# Patient Record
Sex: Female | Born: 1950 | Race: White | Hispanic: No | Marital: Married | Smoking: Current every day smoker
Health system: Southern US, Community
[De-identification: ages and names within clinical notes are randomized; demographics above are authoritative.]

## PROBLEM LIST (undated history)

## (undated) DIAGNOSIS — T7840XA Allergy, unspecified, initial encounter: Secondary | ICD-10-CM

## (undated) HISTORY — DX: Allergy, unspecified, initial encounter: T78.40XA

---

## 2000-09-23 ENCOUNTER — Other Ambulatory Visit: Admission: RE | Admit: 2000-09-23 | Discharge: 2000-09-23 | Payer: Self-pay | Admitting: Obstetrics and Gynecology

## 2001-11-01 ENCOUNTER — Other Ambulatory Visit: Admission: RE | Admit: 2001-11-01 | Discharge: 2001-11-01 | Payer: Self-pay | Admitting: Obstetrics and Gynecology

## 2002-12-29 ENCOUNTER — Other Ambulatory Visit: Admission: RE | Admit: 2002-12-29 | Discharge: 2002-12-29 | Payer: Self-pay | Admitting: Obstetrics and Gynecology

## 2004-01-17 ENCOUNTER — Other Ambulatory Visit: Admission: RE | Admit: 2004-01-17 | Discharge: 2004-01-17 | Payer: Self-pay | Admitting: Obstetrics and Gynecology

## 2005-02-11 ENCOUNTER — Other Ambulatory Visit: Admission: RE | Admit: 2005-02-11 | Discharge: 2005-02-11 | Payer: Self-pay | Admitting: Obstetrics and Gynecology

## 2016-10-07 DIAGNOSIS — Z1231 Encounter for screening mammogram for malignant neoplasm of breast: Secondary | ICD-10-CM | POA: Diagnosis not present

## 2016-10-15 DIAGNOSIS — Z01419 Encounter for gynecological examination (general) (routine) without abnormal findings: Secondary | ICD-10-CM | POA: Diagnosis not present

## 2016-10-15 DIAGNOSIS — N951 Menopausal and female climacteric states: Secondary | ICD-10-CM | POA: Diagnosis not present

## 2016-10-15 DIAGNOSIS — Z6821 Body mass index (BMI) 21.0-21.9, adult: Secondary | ICD-10-CM | POA: Diagnosis not present

## 2016-10-26 DIAGNOSIS — Z23 Encounter for immunization: Secondary | ICD-10-CM | POA: Diagnosis not present

## 2016-11-09 DIAGNOSIS — F17210 Nicotine dependence, cigarettes, uncomplicated: Secondary | ICD-10-CM | POA: Diagnosis not present

## 2016-11-09 DIAGNOSIS — J45909 Unspecified asthma, uncomplicated: Secondary | ICD-10-CM | POA: Diagnosis not present

## 2016-11-09 DIAGNOSIS — J309 Allergic rhinitis, unspecified: Secondary | ICD-10-CM | POA: Diagnosis not present

## 2016-11-09 DIAGNOSIS — E7800 Pure hypercholesterolemia, unspecified: Secondary | ICD-10-CM | POA: Diagnosis not present

## 2016-11-09 DIAGNOSIS — Z0000 Encounter for general adult medical examination without abnormal findings: Secondary | ICD-10-CM | POA: Diagnosis not present

## 2016-12-22 DIAGNOSIS — Z780 Asymptomatic menopausal state: Secondary | ICD-10-CM | POA: Diagnosis not present

## 2017-01-05 DIAGNOSIS — Z23 Encounter for immunization: Secondary | ICD-10-CM | POA: Diagnosis not present

## 2017-02-12 DIAGNOSIS — J069 Acute upper respiratory infection, unspecified: Secondary | ICD-10-CM | POA: Diagnosis not present

## 2017-10-01 DIAGNOSIS — Z23 Encounter for immunization: Secondary | ICD-10-CM | POA: Diagnosis not present

## 2017-10-08 DIAGNOSIS — Z803 Family history of malignant neoplasm of breast: Secondary | ICD-10-CM | POA: Diagnosis not present

## 2017-10-08 DIAGNOSIS — Z1231 Encounter for screening mammogram for malignant neoplasm of breast: Secondary | ICD-10-CM | POA: Diagnosis not present

## 2017-11-15 DIAGNOSIS — J45909 Unspecified asthma, uncomplicated: Secondary | ICD-10-CM | POA: Diagnosis not present

## 2017-11-15 DIAGNOSIS — Z0000 Encounter for general adult medical examination without abnormal findings: Secondary | ICD-10-CM | POA: Diagnosis not present

## 2017-11-15 DIAGNOSIS — F17210 Nicotine dependence, cigarettes, uncomplicated: Secondary | ICD-10-CM | POA: Diagnosis not present

## 2017-11-15 DIAGNOSIS — Z1389 Encounter for screening for other disorder: Secondary | ICD-10-CM | POA: Diagnosis not present

## 2017-11-15 DIAGNOSIS — Z1211 Encounter for screening for malignant neoplasm of colon: Secondary | ICD-10-CM | POA: Diagnosis not present

## 2017-11-15 DIAGNOSIS — J309 Allergic rhinitis, unspecified: Secondary | ICD-10-CM | POA: Diagnosis not present

## 2017-11-15 DIAGNOSIS — E7800 Pure hypercholesterolemia, unspecified: Secondary | ICD-10-CM | POA: Diagnosis not present

## 2019-12-18 ENCOUNTER — Telehealth (HOSPITAL_COMMUNITY): Payer: Self-pay | Admitting: *Deleted

## 2019-12-18 NOTE — Telephone Encounter (Signed)
Unable to reach pt to schedule appt, left VM for pt to call

## 2020-01-02 ENCOUNTER — Other Ambulatory Visit: Payer: Self-pay

## 2020-01-02 ENCOUNTER — Other Ambulatory Visit (HOSPITAL_COMMUNITY): Payer: Self-pay | Admitting: Family Medicine

## 2020-01-02 ENCOUNTER — Ambulatory Visit (HOSPITAL_COMMUNITY)
Admission: RE | Admit: 2020-01-02 | Discharge: 2020-01-02 | Disposition: A | Discharge status: Home / Self Care | Payer: Medicare Other | Source: Ambulatory Visit | Attending: Vascular Surgery | Admitting: Vascular Surgery

## 2020-01-02 DIAGNOSIS — I739 Peripheral vascular disease, unspecified: Secondary | ICD-10-CM

## 2020-04-20 HISTORY — PX: VENOUS ABLATION: SHX2656

## 2020-11-08 ENCOUNTER — Other Ambulatory Visit: Payer: Self-pay | Admitting: Family Medicine

## 2020-11-08 DIAGNOSIS — R0989 Other specified symptoms and signs involving the circulatory and respiratory systems: Secondary | ICD-10-CM

## 2020-11-26 ENCOUNTER — Ambulatory Visit
Admission: RE | Admit: 2020-11-26 | Discharge: 2020-11-26 | Disposition: A | Discharge status: Home / Self Care | Payer: Medicare Other | Source: Ambulatory Visit | Attending: Family Medicine | Admitting: Family Medicine

## 2020-11-26 DIAGNOSIS — R0989 Other specified symptoms and signs involving the circulatory and respiratory systems: Secondary | ICD-10-CM

## 2020-12-24 DIAGNOSIS — I87323 Chronic venous hypertension (idiopathic) with inflammation of bilateral lower extremity: Secondary | ICD-10-CM | POA: Diagnosis not present

## 2021-01-08 ENCOUNTER — Other Ambulatory Visit: Payer: Self-pay

## 2021-01-08 DIAGNOSIS — I739 Peripheral vascular disease, unspecified: Secondary | ICD-10-CM

## 2021-01-28 ENCOUNTER — Ambulatory Visit: Payer: Medicare Other | Admitting: Vascular Surgery

## 2021-01-28 ENCOUNTER — Encounter: Payer: Self-pay | Admitting: Vascular Surgery

## 2021-01-28 ENCOUNTER — Ambulatory Visit (HOSPITAL_COMMUNITY)
Admission: RE | Admit: 2021-01-28 | Discharge: 2021-01-28 | Disposition: A | Discharge status: Home / Self Care | Payer: Medicare Other | Source: Ambulatory Visit | Attending: Vascular Surgery | Admitting: Vascular Surgery

## 2021-01-28 ENCOUNTER — Other Ambulatory Visit: Payer: Self-pay

## 2021-01-28 VITALS — BP 149/69 | HR 65 | Temp 97.7°F | Resp 20 | Ht 65.0 in | Wt 139.0 lb

## 2021-01-28 DIAGNOSIS — I739 Peripheral vascular disease, unspecified: Secondary | ICD-10-CM

## 2021-01-28 DIAGNOSIS — I8390 Asymptomatic varicose veins of unspecified lower extremity: Secondary | ICD-10-CM | POA: Diagnosis not present

## 2021-01-28 NOTE — Progress Notes (Signed)
ASSESSMENT & PLAN:  70 y.o. female without evidence of hemodynamically significant peripheral artery disease. 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 Can follow up with [facility] or her established vein specialist for these. Otherwise follow up with me PRN.  CHIEF COMPLAINT:   Concern for peripheral arterial disease  HISTORY:  HISTORY OF PRESENT ILLNESS: Monica Suarez is a 70 y.o. female referred to clinic for evaluation of possible peripheral arterial disease.  She reported cramping in the legs to her primary care physician which is worsened over the past 4 months.  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  She can walk as far she wants.  Has no symptoms of claudication.  She has no symptoms typical of ischemic rest pain.  She has no history of ischemic ulceration  Past Medical History:  Diagnosis Date  . Allergy     Past Surgical History:  Procedure Laterality Date  . VENOUS ABLATION Left 04/2020    History reviewed. No pertinent family history.  Social History   Socioeconomic History  . Marital status: Married    Spouse name: Not on file  . Number of children: Not on file  . Years of education: Not on file  . Highest education level: Not on file  Occupational History  . Not on file  Tobacco Use  . Smoking status: Current Every Day Smoker    Packs/day: 1.00  . Smokeless tobacco: Never Used  Vaping Use  . Vaping Use: Never used  Substance and Sexual Activity  . Alcohol use: Not Currently  . Drug use: Never  . Sexual activity: Not on file  Other Topics Concern  . Not on file  Social History Narrative  . Not on file   Social Determinants of Health   Financial Resource Strain: Not on file  Food Insecurity: Not on file  Transportation Needs: Not on file  Physical Activity:  Not on file  Stress: Not on file  Social Connections: Not on file  Intimate Partner Violence: Not on file    Allergies  Allergen Reactions  . Clindamycin Hcl     Other reaction(s): severe diarrhea  . Eggs Or Egg-Derived Products     Other reaction(s): Unknown  . Penicillin V     Other reaction(s): Unknown  . Telithromycin     Other reaction(s): glossitis    Current Outpatient Medications  Medication Sig Dispense Refill  . albuterol (VENTOLIN HFA) 108 (90 Base) MCG/ACT inhaler 2 puffs every 6 (six) hours as needed.    Marland Kitchen aspirin 325 MG tablet 1 tablet    . aspirin-acetaminophen-caffeine (EXCEDRIN EXTRA STRENGTH) 250-250-65 MG tablet 1 tablets    . fluticasone (FLONASE) 50 MCG/ACT nasal spray 1 puff in each nostril    . Phenylephrine-DM-GG-APAP (SUDAFED PE COLD/COUGH PO)      No current facility-administered medications for this visit.    REVIEW OF SYSTEMS:  [X]  denotes positive finding, [ ]  denotes negative finding Cardiac  Comments:  Chest pain or chest pressure:    Shortness of breath upon exertion:    Short of breath when lying flat:    Irregular heart rhythm:        Vascular    Pain in calf, thigh, or hip brought on by ambulation:    Pain in feet at night that wakes you up from your sleep:  Blood clot in your veins:    Leg swelling:         Pulmonary    Oxygen at home:    Productive cough:     Wheezing:         Neurologic    Sudden weakness in arms or legs:     Sudden numbness in arms or legs:     Sudden onset of difficulty speaking or slurred speech:    Temporary loss of vision in one eye:     Problems with dizziness:         Gastrointestinal    Blood in stool:     Vomited blood:         Genitourinary    Burning when urinating:     Blood in urine:        Psychiatric    Major depression:         Hematologic    Bleeding problems:    Problems with blood clotting too easily:        Skin    Rashes or ulcers:        Constitutional    Fever or  chills:     PHYSICAL EXAM:   Vitals:   01/28/21 1257  BP: (!) 149/69  Pulse: 65  Resp: 20  Temp: 97.7 F (36.5 C)  SpO2: 99%  Weight: 139 lb (63 kg)  Height: 5\' 5"  (1.651 m)   Constitutional: well appearing in no distress. Appears well nourished.  Neurologic: CN intact. no focal findings. no sensory loss. Psychiatric: Mood and affect symmetric and appropriate. Eyes: No icterus. No conjunctival pallor. Ears, nose, throat: mucous membranes moist. Midline trachea.  Cardiac: regular rate and rhythm.  Respiratory: unlabored. Abdominal: soft, non-tender, non-distended.  Peripheral vascular:  2+ DP pulses bilaterally Extremity: No edema. No cyanosis. No pallor.  Skin: No gangrene. No ulceration.  Lymphatic: No Stemmer's sign. No palpable lymphadenopathy.   DATA REVIEW:    ABI 01/28/21   Yevonne Aline. Stanford Breed, MD [facility] Phone Number: [phone] 01/28/2021 2:18 PM

## 2021-02-18 DIAGNOSIS — R195 Other fecal abnormalities: Secondary | ICD-10-CM | POA: Diagnosis not present

## 2021-04-18 DIAGNOSIS — L570 Actinic keratosis: Secondary | ICD-10-CM | POA: Diagnosis not present

## 2021-04-18 DIAGNOSIS — D225 Melanocytic nevi of trunk: Secondary | ICD-10-CM | POA: Diagnosis not present

## 2021-04-18 DIAGNOSIS — Z85828 Personal history of other malignant neoplasm of skin: Secondary | ICD-10-CM | POA: Diagnosis not present

## 2021-04-18 DIAGNOSIS — Z08 Encounter for follow-up examination after completed treatment for malignant neoplasm: Secondary | ICD-10-CM | POA: Diagnosis not present

## 2021-04-18 DIAGNOSIS — Z1283 Encounter for screening for malignant neoplasm of skin: Secondary | ICD-10-CM | POA: Diagnosis not present

## 2021-04-18 DIAGNOSIS — X32XXXD Exposure to sunlight, subsequent encounter: Secondary | ICD-10-CM | POA: Diagnosis not present

## 2021-10-02 DIAGNOSIS — H2513 Age-related nuclear cataract, bilateral: Secondary | ICD-10-CM | POA: Diagnosis not present

## 2021-10-02 DIAGNOSIS — H43813 Vitreous degeneration, bilateral: Secondary | ICD-10-CM | POA: Diagnosis not present

## 2021-10-02 DIAGNOSIS — H02834 Dermatochalasis of left upper eyelid: Secondary | ICD-10-CM | POA: Diagnosis not present

## 2021-10-02 DIAGNOSIS — H40053 Ocular hypertension, bilateral: Secondary | ICD-10-CM | POA: Diagnosis not present

## 2021-10-02 DIAGNOSIS — H0102B Squamous blepharitis left eye, upper and lower eyelids: Secondary | ICD-10-CM | POA: Diagnosis not present

## 2021-10-02 DIAGNOSIS — H0102A Squamous blepharitis right eye, upper and lower eyelids: Secondary | ICD-10-CM | POA: Diagnosis not present

## 2021-10-02 DIAGNOSIS — H1045 Other chronic allergic conjunctivitis: Secondary | ICD-10-CM | POA: Diagnosis not present

## 2021-10-02 DIAGNOSIS — H02831 Dermatochalasis of right upper eyelid: Secondary | ICD-10-CM | POA: Diagnosis not present

## 2021-10-10 DIAGNOSIS — X32XXXD Exposure to sunlight, subsequent encounter: Secondary | ICD-10-CM | POA: Diagnosis not present

## 2021-10-10 DIAGNOSIS — L570 Actinic keratosis: Secondary | ICD-10-CM | POA: Diagnosis not present

## 2021-10-10 DIAGNOSIS — L820 Inflamed seborrheic keratosis: Secondary | ICD-10-CM | POA: Diagnosis not present

## 2021-10-17 DIAGNOSIS — Z1231 Encounter for screening mammogram for malignant neoplasm of breast: Secondary | ICD-10-CM | POA: Diagnosis not present

## 2021-12-17 DIAGNOSIS — R7303 Prediabetes: Secondary | ICD-10-CM | POA: Diagnosis not present

## 2021-12-17 DIAGNOSIS — I739 Peripheral vascular disease, unspecified: Secondary | ICD-10-CM | POA: Diagnosis not present

## 2021-12-17 DIAGNOSIS — J45909 Unspecified asthma, uncomplicated: Secondary | ICD-10-CM | POA: Diagnosis not present

## 2021-12-17 DIAGNOSIS — Z1389 Encounter for screening for other disorder: Secondary | ICD-10-CM | POA: Diagnosis not present

## 2021-12-17 DIAGNOSIS — Z23 Encounter for immunization: Secondary | ICD-10-CM | POA: Diagnosis not present

## 2021-12-17 DIAGNOSIS — D692 Other nonthrombocytopenic purpura: Secondary | ICD-10-CM | POA: Diagnosis not present

## 2021-12-17 DIAGNOSIS — F17210 Nicotine dependence, cigarettes, uncomplicated: Secondary | ICD-10-CM | POA: Diagnosis not present

## 2021-12-17 DIAGNOSIS — R195 Other fecal abnormalities: Secondary | ICD-10-CM | POA: Diagnosis not present

## 2021-12-17 DIAGNOSIS — Z0000 Encounter for general adult medical examination without abnormal findings: Secondary | ICD-10-CM | POA: Diagnosis not present

## 2021-12-17 DIAGNOSIS — E7800 Pure hypercholesterolemia, unspecified: Secondary | ICD-10-CM | POA: Diagnosis not present

## 2021-12-17 DIAGNOSIS — J309 Allergic rhinitis, unspecified: Secondary | ICD-10-CM | POA: Diagnosis not present

## 2021-12-18 DIAGNOSIS — R195 Other fecal abnormalities: Secondary | ICD-10-CM | POA: Diagnosis not present

## 2022-01-02 DIAGNOSIS — H02814 Retained foreign body in left upper eyelid: Secondary | ICD-10-CM | POA: Diagnosis not present

## 2022-01-05 DIAGNOSIS — Z780 Asymptomatic menopausal state: Secondary | ICD-10-CM | POA: Diagnosis not present

## 2022-01-08 DIAGNOSIS — R195 Other fecal abnormalities: Secondary | ICD-10-CM | POA: Diagnosis not present

## 2022-02-21 IMAGING — US US CAROTID DUPLEX BILAT
1 series · 13 of 24 positions shown · non-contrast
Comparison: None.

CLINICAL DATA: Asymptomatic left bruit.  History of tobacco abuse.

EXAM:
BILATERAL CAROTID DUPLEX ULTRASOUND
TECHNIQUE: Gray scale imaging, color Doppler and duplex ultrasound were
performed of bilateral carotid and vertebral arteries in the neck.

[Series 1: us carotid duplex bilat · 0.05mm/px · 13 of 63 slices shown]
[im 1/63]
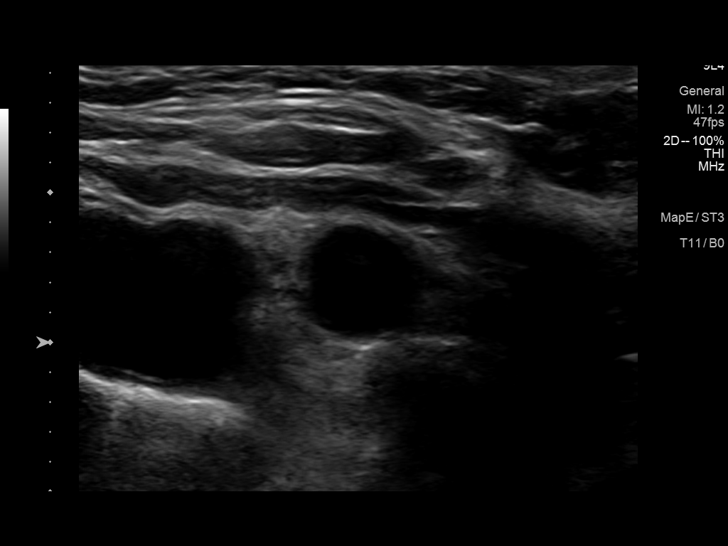
[im 6/63]
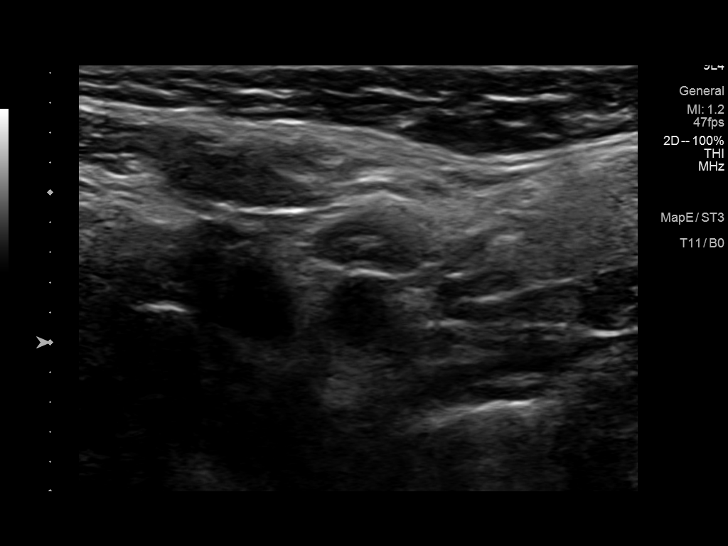
[im 11/63]
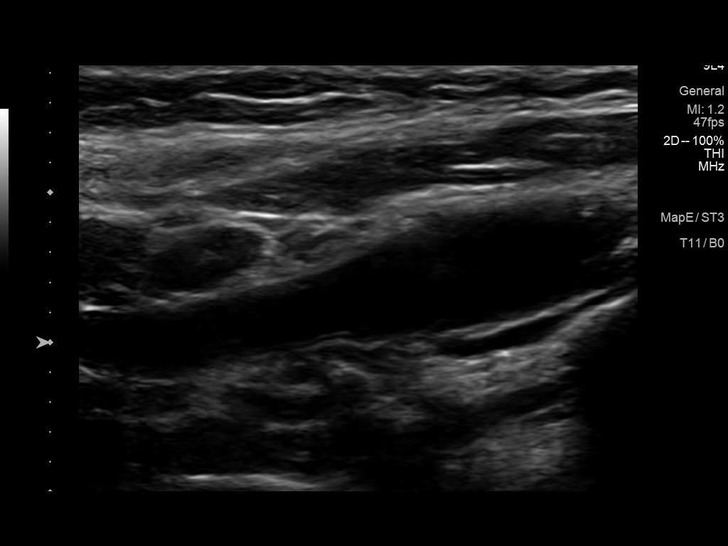
[im 17/63]
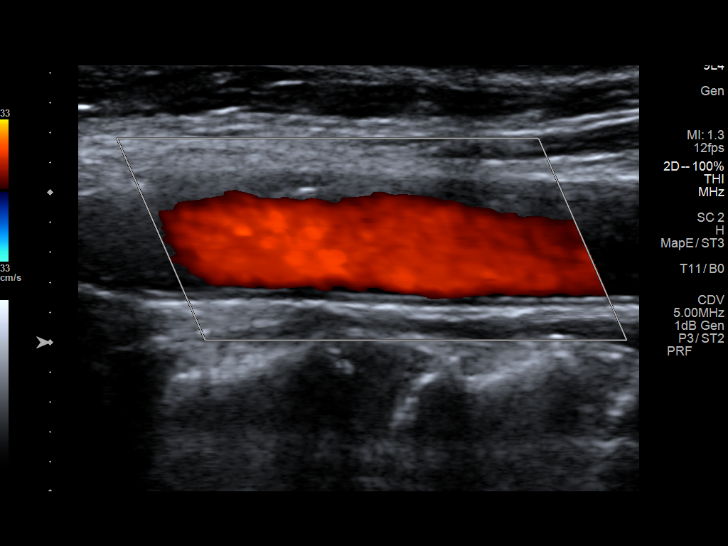
[im 22/63]
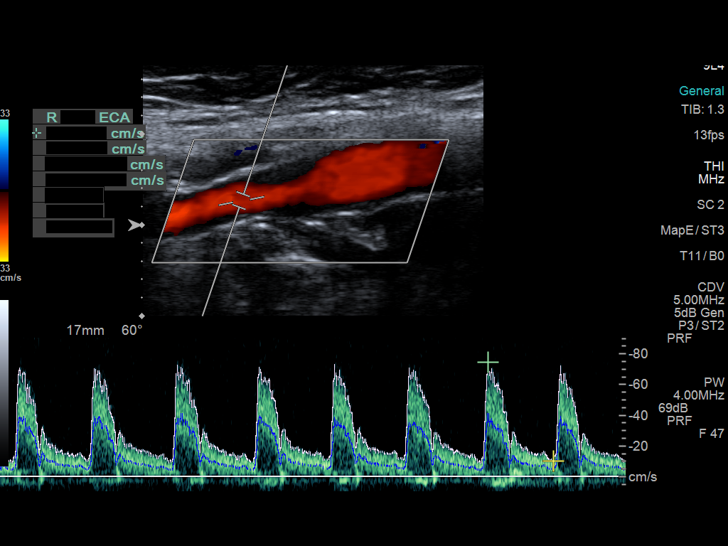
[im 27/63]
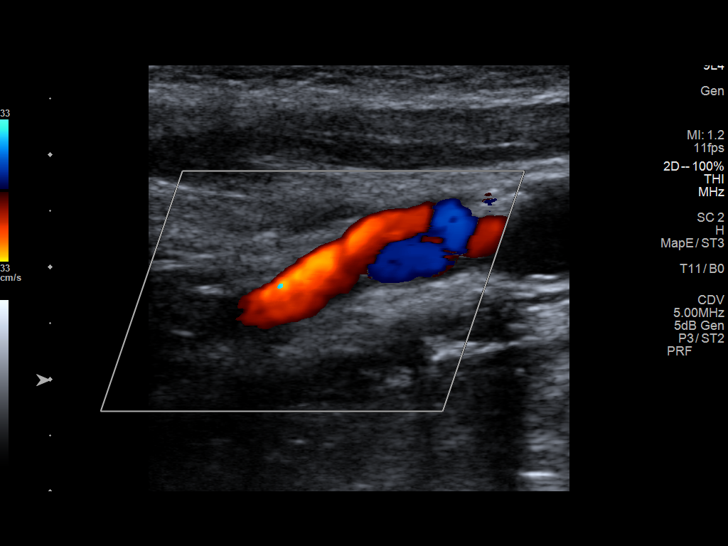
[im 33/63]
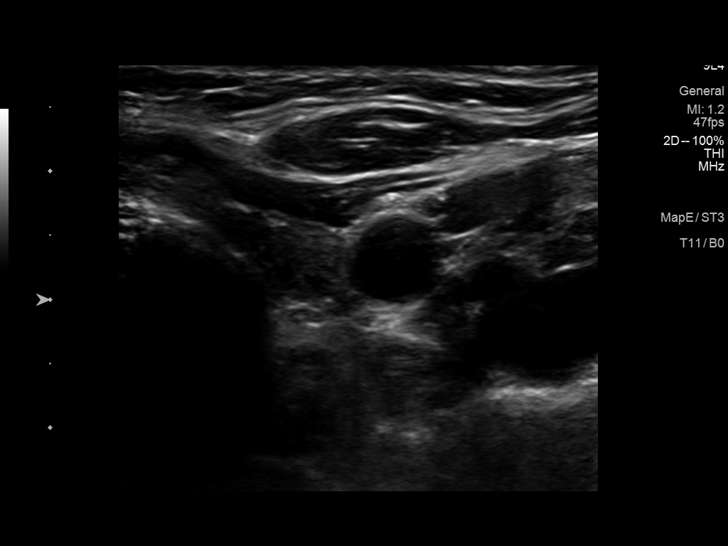
[im 36/63]
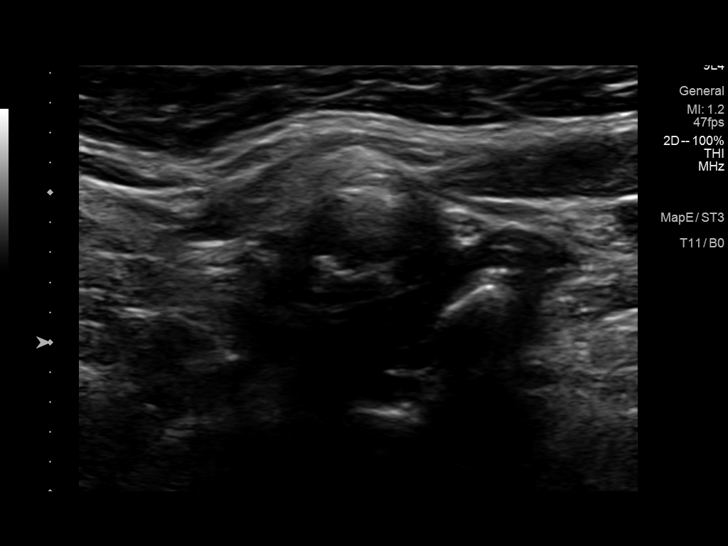
[im 41/63]
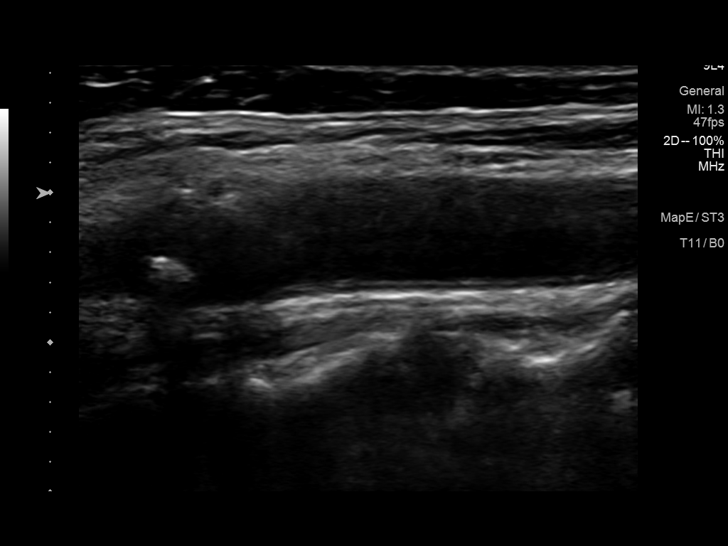
[im 46/63]
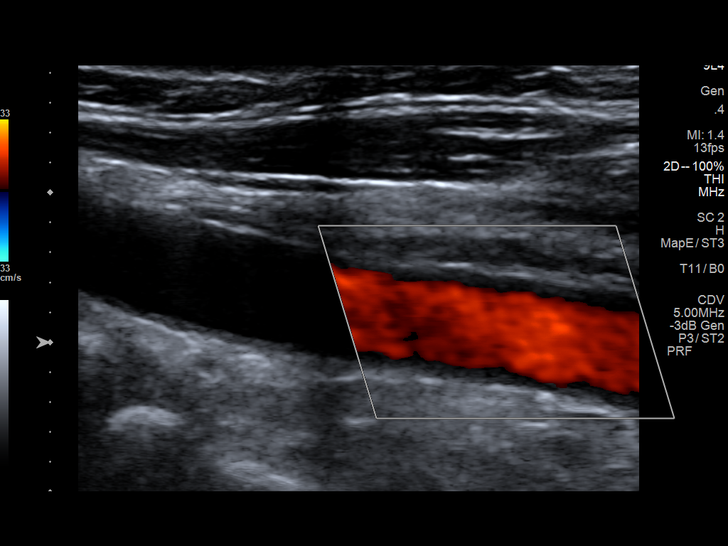
[im 52/63]
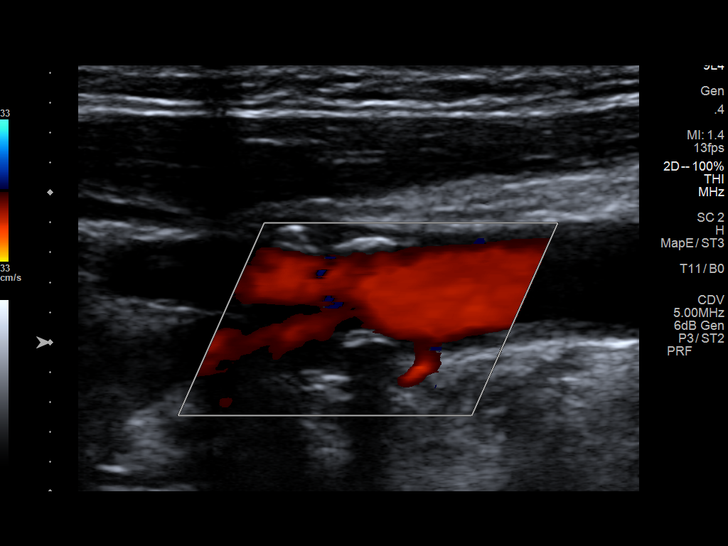
[im 57/63]
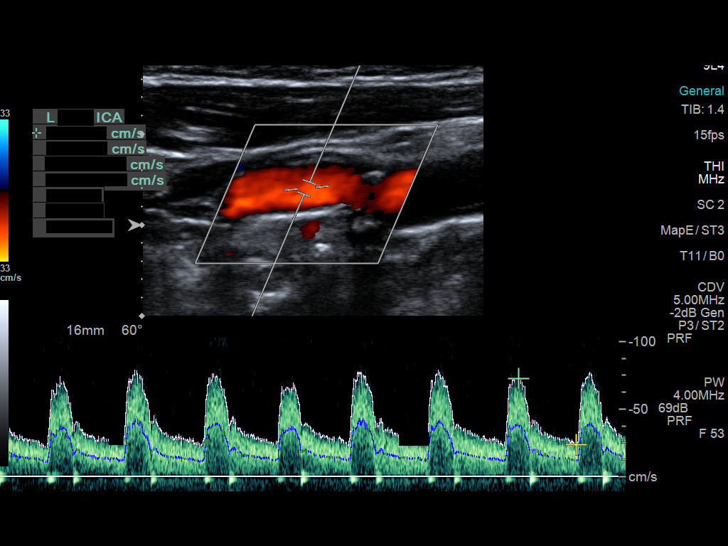
[im 63/63]
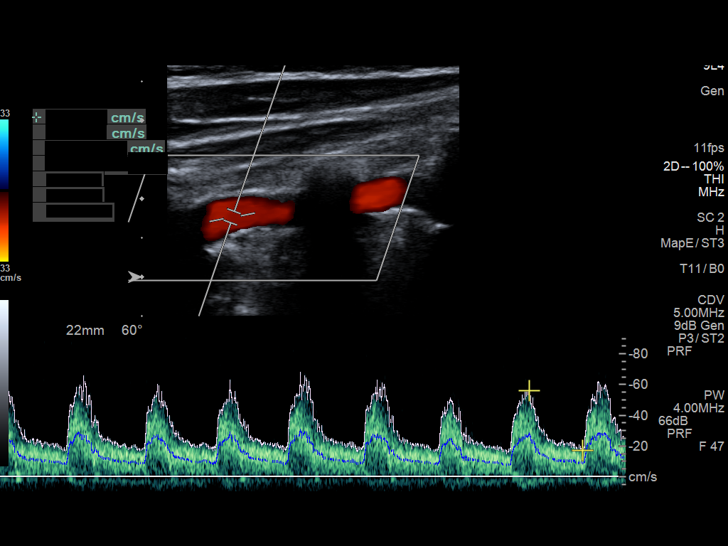

[13 of 24 positions shown; findings below may reference images not displayed]

FINDINGS: Criteria: Quantification of carotid stenosis is based on velocity
parameters that correlate the residual internal carotid diameter
with NASCET-based stenosis levels, using the diameter of the distal
internal carotid lumen as the denominator for stenosis measurement.

The following velocity measurements were obtained:

RIGHT

ICA: 171/45 cm/sec

CCA: 78/17 cm/sec

SYSTOLIC ICA/CCA RATIO:

ECA: 75 cm/sec

LEFT

ICA: 99/30 cm/sec

CCA: 78/15 cm/sec

SYSTOLIC ICA/CCA RATIO:

ECA: 83 cm/sec

RIGHT CAROTID ARTERY: Calcified eccentric plaque scattered through
the common carotid artery and more heavily in the bulb resulting in
at least mild stenosis. There is plaque at the ICA origin resulting
in short-segment stenosis and mildly elevated peak systolic
velocities. Elsewhere normal waveforms and color Doppler signal.
Mild tortuosity of the ICA.

RIGHT VERTEBRAL ARTERY:  Normal flow direction and waveform.

LEFT CAROTID ARTERY: Partially calcified plaque in the bulb and
proximal ICA resulting in at least mild stenosis. Normal waveforms
and color Doppler signal throughout.

LEFT VERTEBRAL ARTERY:  Normal flow direction and waveform.

Upper extremity blood pressures: RIGHT: 175/82 LEFT: 170/85
IMPRESSION: 1. Bilateral carotid bifurcation plaque resulting in 50-69% diameter
proximal right ICA stenosis, less than 50% diameter stenosis on the
left.

## 2022-04-10 DIAGNOSIS — X32XXXD Exposure to sunlight, subsequent encounter: Secondary | ICD-10-CM | POA: Diagnosis not present

## 2022-04-10 DIAGNOSIS — L821 Other seborrheic keratosis: Secondary | ICD-10-CM | POA: Diagnosis not present

## 2022-04-10 DIAGNOSIS — L570 Actinic keratosis: Secondary | ICD-10-CM | POA: Diagnosis not present

## 2022-04-10 DIAGNOSIS — Z1283 Encounter for screening for malignant neoplasm of skin: Secondary | ICD-10-CM | POA: Diagnosis not present

## 2022-07-27 DIAGNOSIS — L603 Nail dystrophy: Secondary | ICD-10-CM | POA: Diagnosis not present

## 2022-10-06 DIAGNOSIS — H40053 Ocular hypertension, bilateral: Secondary | ICD-10-CM | POA: Diagnosis not present

## 2022-10-06 DIAGNOSIS — H0102B Squamous blepharitis left eye, upper and lower eyelids: Secondary | ICD-10-CM | POA: Diagnosis not present

## 2022-10-06 DIAGNOSIS — H02831 Dermatochalasis of right upper eyelid: Secondary | ICD-10-CM | POA: Diagnosis not present

## 2022-10-06 DIAGNOSIS — H0102A Squamous blepharitis right eye, upper and lower eyelids: Secondary | ICD-10-CM | POA: Diagnosis not present

## 2022-10-06 DIAGNOSIS — H02834 Dermatochalasis of left upper eyelid: Secondary | ICD-10-CM | POA: Diagnosis not present

## 2022-10-06 DIAGNOSIS — H2513 Age-related nuclear cataract, bilateral: Secondary | ICD-10-CM | POA: Diagnosis not present

## 2022-10-06 DIAGNOSIS — H43813 Vitreous degeneration, bilateral: Secondary | ICD-10-CM | POA: Diagnosis not present

## 2022-10-06 DIAGNOSIS — H524 Presbyopia: Secondary | ICD-10-CM | POA: Diagnosis not present

## 2022-10-06 DIAGNOSIS — H353131 Nonexudative age-related macular degeneration, bilateral, early dry stage: Secondary | ICD-10-CM | POA: Diagnosis not present

## 2022-10-06 DIAGNOSIS — H1045 Other chronic allergic conjunctivitis: Secondary | ICD-10-CM | POA: Diagnosis not present

## 2022-10-06 DIAGNOSIS — H04123 Dry eye syndrome of bilateral lacrimal glands: Secondary | ICD-10-CM | POA: Diagnosis not present

## 2022-10-09 DIAGNOSIS — L570 Actinic keratosis: Secondary | ICD-10-CM | POA: Diagnosis not present

## 2022-10-09 DIAGNOSIS — X32XXXD Exposure to sunlight, subsequent encounter: Secondary | ICD-10-CM | POA: Diagnosis not present

## 2022-11-16 DIAGNOSIS — Z1231 Encounter for screening mammogram for malignant neoplasm of breast: Secondary | ICD-10-CM | POA: Diagnosis not present

## 2022-11-18 DIAGNOSIS — M5432 Sciatica, left side: Secondary | ICD-10-CM | POA: Diagnosis not present

## 2022-11-27 DIAGNOSIS — M5451 Vertebrogenic low back pain: Secondary | ICD-10-CM | POA: Diagnosis not present

## 2022-11-27 DIAGNOSIS — M5136 Other intervertebral disc degeneration, lumbar region: Secondary | ICD-10-CM | POA: Diagnosis not present

## 2023-01-12 DIAGNOSIS — Z5320 Procedure and treatment not carried out because of patient's decision for unspecified reasons: Secondary | ICD-10-CM | POA: Diagnosis not present

## 2023-01-12 DIAGNOSIS — E7800 Pure hypercholesterolemia, unspecified: Secondary | ICD-10-CM | POA: Diagnosis not present

## 2023-01-12 DIAGNOSIS — J45909 Unspecified asthma, uncomplicated: Secondary | ICD-10-CM | POA: Diagnosis not present

## 2023-01-12 DIAGNOSIS — J309 Allergic rhinitis, unspecified: Secondary | ICD-10-CM | POA: Diagnosis not present

## 2023-01-12 DIAGNOSIS — F17210 Nicotine dependence, cigarettes, uncomplicated: Secondary | ICD-10-CM | POA: Diagnosis not present

## 2023-01-12 DIAGNOSIS — Z0000 Encounter for general adult medical examination without abnormal findings: Secondary | ICD-10-CM | POA: Diagnosis not present

## 2023-01-12 DIAGNOSIS — I6521 Occlusion and stenosis of right carotid artery: Secondary | ICD-10-CM | POA: Diagnosis not present

## 2023-01-12 DIAGNOSIS — R7303 Prediabetes: Secondary | ICD-10-CM | POA: Diagnosis not present

## 2023-01-12 DIAGNOSIS — D692 Other nonthrombocytopenic purpura: Secondary | ICD-10-CM | POA: Diagnosis not present

## 2023-01-27 DIAGNOSIS — H5712 Ocular pain, left eye: Secondary | ICD-10-CM | POA: Diagnosis not present

## 2023-01-27 DIAGNOSIS — H10013 Acute follicular conjunctivitis, bilateral: Secondary | ICD-10-CM | POA: Diagnosis not present

## 2023-02-01 DIAGNOSIS — H0102B Squamous blepharitis left eye, upper and lower eyelids: Secondary | ICD-10-CM | POA: Diagnosis not present

## 2023-02-01 DIAGNOSIS — H0102A Squamous blepharitis right eye, upper and lower eyelids: Secondary | ICD-10-CM | POA: Diagnosis not present

## 2023-02-01 DIAGNOSIS — H04123 Dry eye syndrome of bilateral lacrimal glands: Secondary | ICD-10-CM | POA: Diagnosis not present

## 2023-02-01 DIAGNOSIS — H10013 Acute follicular conjunctivitis, bilateral: Secondary | ICD-10-CM | POA: Diagnosis not present

## 2023-02-01 DIAGNOSIS — H1045 Other chronic allergic conjunctivitis: Secondary | ICD-10-CM | POA: Diagnosis not present

## 2023-02-01 DIAGNOSIS — H40053 Ocular hypertension, bilateral: Secondary | ICD-10-CM | POA: Diagnosis not present

## 2023-02-22 DIAGNOSIS — H0102B Squamous blepharitis left eye, upper and lower eyelids: Secondary | ICD-10-CM | POA: Diagnosis not present

## 2023-02-22 DIAGNOSIS — H40053 Ocular hypertension, bilateral: Secondary | ICD-10-CM | POA: Diagnosis not present

## 2023-02-22 DIAGNOSIS — H0102A Squamous blepharitis right eye, upper and lower eyelids: Secondary | ICD-10-CM | POA: Diagnosis not present

## 2023-02-22 DIAGNOSIS — H04123 Dry eye syndrome of bilateral lacrimal glands: Secondary | ICD-10-CM | POA: Diagnosis not present

## 2023-02-22 DIAGNOSIS — H10013 Acute follicular conjunctivitis, bilateral: Secondary | ICD-10-CM | POA: Diagnosis not present

## 2023-02-22 DIAGNOSIS — H1045 Other chronic allergic conjunctivitis: Secondary | ICD-10-CM | POA: Diagnosis not present

## 2023-03-15 DIAGNOSIS — H10013 Acute follicular conjunctivitis, bilateral: Secondary | ICD-10-CM | POA: Diagnosis not present

## 2023-03-15 DIAGNOSIS — H04123 Dry eye syndrome of bilateral lacrimal glands: Secondary | ICD-10-CM | POA: Diagnosis not present

## 2023-03-15 DIAGNOSIS — H0102A Squamous blepharitis right eye, upper and lower eyelids: Secondary | ICD-10-CM | POA: Diagnosis not present

## 2023-03-15 DIAGNOSIS — H40053 Ocular hypertension, bilateral: Secondary | ICD-10-CM | POA: Diagnosis not present

## 2023-03-15 DIAGNOSIS — H1045 Other chronic allergic conjunctivitis: Secondary | ICD-10-CM | POA: Diagnosis not present

## 2023-03-15 DIAGNOSIS — H0102B Squamous blepharitis left eye, upper and lower eyelids: Secondary | ICD-10-CM | POA: Diagnosis not present

## 2023-04-15 DIAGNOSIS — X32XXXD Exposure to sunlight, subsequent encounter: Secondary | ICD-10-CM | POA: Diagnosis not present

## 2023-04-15 DIAGNOSIS — L821 Other seborrheic keratosis: Secondary | ICD-10-CM | POA: Diagnosis not present

## 2023-04-15 DIAGNOSIS — Z1283 Encounter for screening for malignant neoplasm of skin: Secondary | ICD-10-CM | POA: Diagnosis not present

## 2023-04-15 DIAGNOSIS — L570 Actinic keratosis: Secondary | ICD-10-CM | POA: Diagnosis not present

## 2023-06-18 DIAGNOSIS — H10013 Acute follicular conjunctivitis, bilateral: Secondary | ICD-10-CM | POA: Diagnosis not present

## 2023-06-18 DIAGNOSIS — H43813 Vitreous degeneration, bilateral: Secondary | ICD-10-CM | POA: Diagnosis not present

## 2023-06-18 DIAGNOSIS — H04123 Dry eye syndrome of bilateral lacrimal glands: Secondary | ICD-10-CM | POA: Diagnosis not present

## 2023-06-18 DIAGNOSIS — H02831 Dermatochalasis of right upper eyelid: Secondary | ICD-10-CM | POA: Diagnosis not present

## 2023-06-18 DIAGNOSIS — H353131 Nonexudative age-related macular degeneration, bilateral, early dry stage: Secondary | ICD-10-CM | POA: Diagnosis not present

## 2023-06-18 DIAGNOSIS — H0102B Squamous blepharitis left eye, upper and lower eyelids: Secondary | ICD-10-CM | POA: Diagnosis not present

## 2023-06-18 DIAGNOSIS — H2513 Age-related nuclear cataract, bilateral: Secondary | ICD-10-CM | POA: Diagnosis not present

## 2023-06-18 DIAGNOSIS — H1045 Other chronic allergic conjunctivitis: Secondary | ICD-10-CM | POA: Diagnosis not present

## 2023-06-18 DIAGNOSIS — H0102A Squamous blepharitis right eye, upper and lower eyelids: Secondary | ICD-10-CM | POA: Diagnosis not present

## 2023-06-18 DIAGNOSIS — H02834 Dermatochalasis of left upper eyelid: Secondary | ICD-10-CM | POA: Diagnosis not present

## 2023-06-18 DIAGNOSIS — H40053 Ocular hypertension, bilateral: Secondary | ICD-10-CM | POA: Diagnosis not present

## 2023-08-31 DIAGNOSIS — R051 Acute cough: Secondary | ICD-10-CM | POA: Diagnosis not present

## 2023-08-31 DIAGNOSIS — R059 Cough, unspecified: Secondary | ICD-10-CM | POA: Diagnosis not present

## 2023-08-31 DIAGNOSIS — I700 Atherosclerosis of aorta: Secondary | ICD-10-CM | POA: Diagnosis not present

## 2023-10-08 DIAGNOSIS — H10013 Acute follicular conjunctivitis, bilateral: Secondary | ICD-10-CM | POA: Diagnosis not present

## 2023-10-08 DIAGNOSIS — H353131 Nonexudative age-related macular degeneration, bilateral, early dry stage: Secondary | ICD-10-CM | POA: Diagnosis not present

## 2023-10-08 DIAGNOSIS — H2513 Age-related nuclear cataract, bilateral: Secondary | ICD-10-CM | POA: Diagnosis not present

## 2023-10-08 DIAGNOSIS — H43813 Vitreous degeneration, bilateral: Secondary | ICD-10-CM | POA: Diagnosis not present

## 2023-10-08 DIAGNOSIS — H02834 Dermatochalasis of left upper eyelid: Secondary | ICD-10-CM | POA: Diagnosis not present

## 2023-10-08 DIAGNOSIS — H02831 Dermatochalasis of right upper eyelid: Secondary | ICD-10-CM | POA: Diagnosis not present

## 2023-10-08 DIAGNOSIS — H1045 Other chronic allergic conjunctivitis: Secondary | ICD-10-CM | POA: Diagnosis not present

## 2023-10-08 DIAGNOSIS — H0102A Squamous blepharitis right eye, upper and lower eyelids: Secondary | ICD-10-CM | POA: Diagnosis not present

## 2023-10-08 DIAGNOSIS — H04123 Dry eye syndrome of bilateral lacrimal glands: Secondary | ICD-10-CM | POA: Diagnosis not present

## 2023-10-08 DIAGNOSIS — H40053 Ocular hypertension, bilateral: Secondary | ICD-10-CM | POA: Diagnosis not present

## 2023-10-08 DIAGNOSIS — H0102B Squamous blepharitis left eye, upper and lower eyelids: Secondary | ICD-10-CM | POA: Diagnosis not present

## 2023-10-13 DIAGNOSIS — M545 Low back pain, unspecified: Secondary | ICD-10-CM | POA: Diagnosis not present

## 2023-10-13 DIAGNOSIS — M62830 Muscle spasm of back: Secondary | ICD-10-CM | POA: Diagnosis not present

## 2023-10-14 DIAGNOSIS — X32XXXD Exposure to sunlight, subsequent encounter: Secondary | ICD-10-CM | POA: Diagnosis not present

## 2023-10-14 DIAGNOSIS — L570 Actinic keratosis: Secondary | ICD-10-CM | POA: Diagnosis not present

## 2023-11-23 DIAGNOSIS — Z1231 Encounter for screening mammogram for malignant neoplasm of breast: Secondary | ICD-10-CM | POA: Diagnosis not present

## 2024-01-04 DIAGNOSIS — H40053 Ocular hypertension, bilateral: Secondary | ICD-10-CM | POA: Diagnosis not present

## 2024-02-01 DIAGNOSIS — H40053 Ocular hypertension, bilateral: Secondary | ICD-10-CM | POA: Diagnosis not present

## 2024-02-02 DIAGNOSIS — F17210 Nicotine dependence, cigarettes, uncomplicated: Secondary | ICD-10-CM | POA: Diagnosis not present

## 2024-02-02 DIAGNOSIS — R7303 Prediabetes: Secondary | ICD-10-CM | POA: Diagnosis not present

## 2024-02-02 DIAGNOSIS — J309 Allergic rhinitis, unspecified: Secondary | ICD-10-CM | POA: Diagnosis not present

## 2024-02-02 DIAGNOSIS — E7800 Pure hypercholesterolemia, unspecified: Secondary | ICD-10-CM | POA: Diagnosis not present

## 2024-02-02 DIAGNOSIS — Z5320 Procedure and treatment not carried out because of patient's decision for unspecified reasons: Secondary | ICD-10-CM | POA: Diagnosis not present

## 2024-02-02 DIAGNOSIS — Z0000 Encounter for general adult medical examination without abnormal findings: Secondary | ICD-10-CM | POA: Diagnosis not present

## 2024-02-02 DIAGNOSIS — I739 Peripheral vascular disease, unspecified: Secondary | ICD-10-CM | POA: Diagnosis not present

## 2024-02-02 DIAGNOSIS — I6521 Occlusion and stenosis of right carotid artery: Secondary | ICD-10-CM | POA: Diagnosis not present

## 2024-02-02 DIAGNOSIS — J45909 Unspecified asthma, uncomplicated: Secondary | ICD-10-CM | POA: Diagnosis not present

## 2024-04-13 DIAGNOSIS — D225 Melanocytic nevi of trunk: Secondary | ICD-10-CM | POA: Diagnosis not present

## 2024-04-13 DIAGNOSIS — Z1283 Encounter for screening for malignant neoplasm of skin: Secondary | ICD-10-CM | POA: Diagnosis not present

## 2024-04-13 DIAGNOSIS — L570 Actinic keratosis: Secondary | ICD-10-CM | POA: Diagnosis not present

## 2024-04-13 DIAGNOSIS — X32XXXD Exposure to sunlight, subsequent encounter: Secondary | ICD-10-CM | POA: Diagnosis not present

## 2024-06-26 DIAGNOSIS — H40053 Ocular hypertension, bilateral: Secondary | ICD-10-CM | POA: Diagnosis not present

## 2024-07-07 DIAGNOSIS — U071 COVID-19: Secondary | ICD-10-CM | POA: Diagnosis not present

## 2024-10-12 DIAGNOSIS — X32XXXD Exposure to sunlight, subsequent encounter: Secondary | ICD-10-CM | POA: Diagnosis not present

## 2024-10-12 DIAGNOSIS — L570 Actinic keratosis: Secondary | ICD-10-CM | POA: Diagnosis not present

## 2024-10-12 DIAGNOSIS — L820 Inflamed seborrheic keratosis: Secondary | ICD-10-CM | POA: Diagnosis not present
